# Patient Record
Sex: Female | Born: 1990 | Race: Black or African American | Hispanic: No | Marital: Single | Smoking: Never smoker
Health system: Southern US, Community
[De-identification: ages and names within clinical notes are randomized; demographics above are authoritative.]

## PROBLEM LIST (undated history)

## (undated) HISTORY — PX: KNEE ARTHROSCOPY: SHX127

---
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
## 2008-08-03 ENCOUNTER — Emergency Department (HOSPITAL_COMMUNITY): Admission: EM | Admit: 2008-08-03 | Discharge: 2008-08-03 | Payer: Self-pay | Admitting: Emergency Medicine

## 2011-04-26 NOTE — Discharge Summary (Signed)
Whitney Thompson, GEISLER                            ACCOUNT NO.:  [id number]   MEDICAL RECORD NO.:  [id number]                   PATIENT TYPE:  INP   LOCATION:  6148                                 FACILITY:  [facility]   PHYSICIAN:  Pediatrics Resident                 DATE OF BIRTH:  1991/09/20   DATE OF ADMISSION:  02/22/2004  DATE OF DISCHARGE:  02/25/2004                                 DISCHARGE SUMMARY   [name] was admitted on 02/23/2004 with 4 days of fever and a stabbing  headache and 1 day of a stiff neck.  She had both photo and phonophobia and  mild tachycardia on admission.  She was afebrile at admission and had no  papilledema.  She had no neurological abnormalities noted on admission.  She  had 6.9 white blood cells, 72% PMNs.  A lumbar puncture was done and her CSF  was colorless and clear.  Tube 1 had 230 RBCs, 286 WBCs, and 100%  lymphocytes.  Tube 4 had 84 RBCs, 81 WBCs, and 100% lymphocytes.  The CSF  showed 91 mg/dL of total protein and 51 mg/dL of glucose.  The Gram stain  showed no organisms and mostly mononuclear WBCs.  The CSF culture was  negative for growth at 2 days.  Prior to the LP, a CT of the head was done  which was normal and showed no intracranial masses.  In the emergency room,  the patient was started on ceftriaxone 2 g q.24h. and morphine for pain.  Of  note, the patient had a prior history of hives with codeine but had no  adverse reaction to morphine.  Due to the patient's history of exercise-  induced asthma she was given an albuterol MDI q.6h. p.r.n. which was not  needed during the hospitalization.  On day 2 of hospitalization, her  morphine was discontinued secondary to concern of allergy in the past and  she was put on Demerol 50 mg.  At the time of discharge, this was changed to  Naprosyn 500 mg p.o. x 7 days 2 times a day then every 12 hours if she is  still in pain.  Because of the CSF results listed above that are consistent  with an aseptic  meningitis and the negative CSF cultures x 48 hours we felt  assured that [facility] had aseptic meningitis and she was discharged on  02/25/2004.  At this time, she was tolerating p.o. fluids well and the  ceftriaxone was discontinued secondary to the negative cultures x 48 hours.  The patient's condition was improved at the time of discharge.  She was  instructed to see her PCP (Dr. Nash Dimmer at [facility]) at 10:30 a.m. on 02/27/2004.  She was not sent home with any diet  restrictions or activity restrictions and was informed that she could return  to school and her normal activities as her  energy and pain level tolerated.   ADDENDUM:  The enterovirus PCR test which was sent per the rest of Dr.  Nash Dimmer on the morning after admission came back with negative PCR results.  This does not change our treatment regimen.                                                Pediatrics Resident    PR/MEDQ  D:  02/28/2004  T:  03/01/2004  Job:  [id number]

## 2012-05-08 ENCOUNTER — Emergency Department (HOSPITAL_COMMUNITY): Payer: Self-pay

## 2012-05-08 ENCOUNTER — Emergency Department (HOSPITAL_COMMUNITY)
Admission: EM | Admit: 2012-05-08 | Discharge: 2012-05-08 | Disposition: A | Discharge status: Home / Self Care | Payer: Self-pay | Attending: Emergency Medicine | Admitting: Emergency Medicine

## 2012-05-08 ENCOUNTER — Encounter (HOSPITAL_COMMUNITY): Payer: Self-pay | Admitting: *Deleted

## 2012-05-08 DIAGNOSIS — M25562 Pain in left knee: Secondary | ICD-10-CM

## 2012-05-08 DIAGNOSIS — M25569 Pain in unspecified knee: Secondary | ICD-10-CM | POA: Insufficient documentation

## 2012-05-08 MED ORDER — CYCLOBENZAPRINE HCL 10 MG PO TABS: 10.0000 mg | ORAL_TABLET | Freq: Two times a day (BID) | ORAL | Status: AC | PRN

## 2012-05-08 MED ORDER — OXYCODONE-ACETAMINOPHEN 5-325 MG PO TABS
1.0000 | ORAL_TABLET | Freq: Once | ORAL | Status: AC
  Administered 2012-05-08: 1 via ORAL
  Filled 2012-05-08: qty 1

## 2012-05-08 NOTE — Discharge Instructions (Signed)
Ms Grobe the x-ray of no acute findings. Followup with your trainer and the ortho referral.  Continue the mobic and we will add muscle relexor but do not drive with this medication.  Return to ER for severe pain or other concerns.

## 2012-05-08 NOTE — ED Provider Notes (Signed)
Medical screening examination/treatment/procedure(s) were performed by non-physician practitioner and as supervising physician I was immediately available for consultation/collaboration.    Nelia Shi, MD 05/08/12 [phone]

## 2012-05-08 NOTE — ED Provider Notes (Signed)
History     CSN: [id number]  Arrival date & time 05/08/12  7829   First MD Initiated Contact with Patient 05/08/12 [phone]      Chief Complaint  Patient presents with  . Knee Pain    (Consider location/radiation/quality/duration/timing/severity/associated sxs/prior treatment) Patient is a 21 y.o. female presenting with knee pain. The history is provided by the patient. No language interpreter was used.  Knee Pain This is a chronic problem. The current episode started more than 1 year ago. The problem occurs daily. The problem has been gradually worsening. Associated symptoms include myalgias. Pertinent negatives include no joint swelling or numbness. The symptoms are aggravated by nothing. She has tried nothing for the symptoms.   21 year old female coming in with left chronic knee pain. 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 The pain is chronic and it has been going on for 4 years to her L knee.   Patient is able to bear weight. No patellar tenderness or fibular head tenderness.   She had the x-ray of her left knee that was unremarkable  4 years ago. States that she was playing basketball 2 weeks ago and got kneed above the knee and just wants to be checked out and get something for pain. No bruising or deformity to the L quadricep muscle.  Good cms.  History reviewed. No pertinent past medical history.  History reviewed. No pertinent past surgical history.  No family history on file.  History  Substance Use Topics  . Smoking status: Never Smoker   . Smokeless tobacco: Not on file  . Alcohol Use: No    OB History    Grav Para Term Preterm Abortions TAB SAB Ect Mult Living                  Review of Systems  Constitutional: Negative.   HENT: Negative.   Eyes: Negative.   Respiratory: Negative.   Cardiovascular: Negative.   Gastrointestinal: Negative.   Musculoskeletal: Positive for myalgias.  Negative for joint swelling.       L Knee pain/ L quad pain  Neurological: Negative.  Negative for numbness.  Psychiatric/Behavioral: Negative.   All other systems reviewed and are negative.    Allergies  Codeine  Home Medications  No current outpatient prescriptions on file.  BP 122/84  Pulse 70  Temp(Src) 97.5 F (36.4 C) (Oral)  Resp 20  SpO2 99%  LMP 04/27/2012  Physical Exam  Nursing note and vitals reviewed. Constitutional: She is oriented to person, place, and time. She appears well-developed and well-nourished.  HENT:  Head: Normocephalic and atraumatic.  Eyes: Conjunctivae and EOM are normal. Pupils are equal, round, and reactive to light.  Neck: Normal range of motion. Neck supple.  Cardiovascular: Normal rate, regular rhythm, normal heart sounds and intact distal pulses.  Exam reveals no gallop and no friction rub.   No murmur heard. Pulmonary/Chest: Effort normal and breath sounds normal.  Abdominal: Soft. Bowel sounds are normal.  Musculoskeletal: Normal range of motion. She exhibits no edema and no tenderness.       Pain with palpatation to the L lateral knee.  No pain with external or internal rotation of the foot.   Neurological: She is alert and oriented to person, place, and time. She has normal reflexes.  Skin: Skin is warm and dry.  Psychiatric: She has a normal mood and affect.    ED Course  Procedures (  including critical care time)  Labs Reviewed - No data to display No results found.   No diagnosis found.    MDM   L knee pain x 4 years and L quad pain x 2-3 weeks.  Film of knee negative for acute findings.  rx for Flexoril.  Continue mobic.  Follow up with ortho next week.  No crutches or immobilizer required.         Remi Haggard, NP 05/08/12 1904

## 2012-05-08 NOTE — ED Notes (Signed)
Patient transported to X-ray 

## 2012-05-08 NOTE — ED Notes (Signed)
Pt reports injured left knee x 2-3 weeks ago while playing basketball. Pt reports "kneed" in left quadricept. Hx of similar pain x a couple of years. No obvious deformity.

## 2013-05-12 ENCOUNTER — Emergency Department (HOSPITAL_COMMUNITY)
Admission: EM | Admit: 2013-05-12 | Discharge: 2013-05-12 | Disposition: A | Discharge status: Home / Self Care | Payer: Self-pay | Attending: Emergency Medicine | Admitting: Emergency Medicine

## 2013-05-12 ENCOUNTER — Encounter (HOSPITAL_COMMUNITY): Payer: Self-pay | Admitting: Emergency Medicine

## 2013-05-12 ENCOUNTER — Emergency Department (HOSPITAL_COMMUNITY): Payer: Self-pay

## 2013-05-12 DIAGNOSIS — Z791 Long term (current) use of non-steroidal anti-inflammatories (NSAID): Secondary | ICD-10-CM | POA: Insufficient documentation

## 2013-05-12 DIAGNOSIS — Y929 Unspecified place or not applicable: Secondary | ICD-10-CM | POA: Insufficient documentation

## 2013-05-12 DIAGNOSIS — M542 Cervicalgia: Secondary | ICD-10-CM | POA: Insufficient documentation

## 2013-05-12 DIAGNOSIS — M25511 Pain in right shoulder: Secondary | ICD-10-CM

## 2013-05-12 DIAGNOSIS — X58XXXA Exposure to other specified factors, initial encounter: Secondary | ICD-10-CM | POA: Insufficient documentation

## 2013-05-12 DIAGNOSIS — Y9367 Activity, basketball: Secondary | ICD-10-CM | POA: Insufficient documentation

## 2013-05-12 DIAGNOSIS — IMO0001 Reserved for inherently not codable concepts without codable children: Secondary | ICD-10-CM | POA: Insufficient documentation

## 2013-05-12 DIAGNOSIS — M259 Joint disorder, unspecified: Secondary | ICD-10-CM | POA: Insufficient documentation

## 2013-05-12 MED ORDER — NAPROXEN 500 MG PO TABS: 500.0000 mg | ORAL_TABLET | Freq: Two times a day (BID) | ORAL | Status: DC

## 2013-05-12 NOTE — ED Notes (Signed)
Pt states she is having pain in her right shoulder pain for about the past month  Pt states she had no definite injury   Pt states she is very athletic and notices the pain mostly when she shoots a basketball  Pt states now the pain is going down to her elbow

## 2013-05-12 NOTE — ED Notes (Signed)
Patient returned from X-ray 

## 2013-05-12 NOTE — ED Provider Notes (Signed)
History     CSN: [id number]  Arrival date & time 05/12/13  2046   First MD Initiated Contact with Patient 05/12/13 2109      Chief Complaint  Patient presents with  . Shoulder Pain    (Consider location/radiation/quality/duration/timing/severity/associated sxs/prior treatment) Patient is a 22 y.o. female presenting with shoulder pain. The history is provided by the patient.  Shoulder Pain This is a new problem. Pertinent negatives include no chest pain, no abdominal pain and no shortness of breath.   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 Patient having recurrent pain at the a.c. joint area with shooting radiates down to the elbow. No numbness in the fingers. The pain can sometimes be acute very much of a shooting pain in the arm feels numb for a brief period of time but stopped persistent. Pain at worst can be a 10 out of 10. No new injury. Pain radiates somewhat towards the neck and down to the elbow. Mostly aggravated by the shooting mechanism.  History reviewed. No pertinent past medical history.  History reviewed. No pertinent past surgical history.  Family History  Problem Relation Age of Onset  . Kidney disease Other   . Hypertension Other     History  Substance Use Topics  . Smoking status: Never Smoker   . Smokeless tobacco: Not on file  . Alcohol Use: No    OB History   Grav Para Term Preterm Abortions TAB SAB Ect Mult Living                  Review of Systems  Constitutional: Negative for fever.  HENT: Positive for neck pain. Negative for neck stiffness.   Eyes: Negative for visual disturbance.  Respiratory: Negative for shortness of breath.   Cardiovascular: Negative for chest pain.  Gastrointestinal: Negative for abdominal pain.  Genitourinary: Negative for dysuria.  Musculoskeletal: Negative for back pain and joint swelling.  Skin: Negative for rash.  Neurological: Negative for weakness and  numbness.  Psychiatric/Behavioral: Negative for confusion.    Allergies  Codeine  Home Medications   Current Outpatient Rx  Name  Route  Sig  Dispense  Refill  . Multiple Vitamin (MULITIVITAMIN WITH MINERALS) TABS   Oral   Take 1 tablet by mouth daily.         . naproxen sodium (ANAPROX) 220 MG tablet   Oral   Take 440 mg by mouth 2 (two) times daily with a meal.         . naproxen (NAPROSYN) 500 MG tablet   Oral   Take 1 tablet (500 mg total) by mouth 2 (two) times daily.   14 tablet   0     BP 114/69  Pulse 78  Temp(Src) 98.5 F (36.9 C) (Oral)  Resp 16  Ht 6\' 1"  (1.854 m)  Wt 150 lb (68.04 kg)  BMI 19.79 kg/m2  SpO2 100%  LMP 05/11/2013  Physical Exam  Nursing note and vitals reviewed. Constitutional: She is oriented to person, place, and time. She appears well-developed and well-nourished. No distress.  HENT:  Head: Normocephalic and atraumatic.  Eyes: Conjunctivae are normal. Pupils are equal, round, and reactive to light.  Neck: Normal range of motion. Neck supple. No JVD present. No tracheal deviation present.  Cardiovascular: Normal rate, regular rhythm, normal heart sounds and intact distal pulses.   No murmur heard. Pulmonary/Chest: Effort normal and breath sounds normal.  Abdominal: Soft. Bowel sounds are normal.  Musculoskeletal: Normal range of motion. She exhibits tenderness. She exhibits no edema.  Mild tenderness to palpation of the right shoulder at the a.c. joint. Good range of motion good range of motion of arm above head so not likely rotator cuff. No sensory deficits radial pulses 2+. Patient's right hand dominant.  Lymphadenopathy:    She has no cervical adenopathy.  Neurological: She is alert and oriented to person, place, and time. No cranial nerve deficit. She exhibits normal muscle tone. Coordination normal.  Skin: Skin is warm. No rash noted.    ED Course  Procedures (including critical care time)  Labs Reviewed - No data to  display Dg Shoulder Right  05/12/2013   *RADIOLOGY REPORT*  Clinical Data: Right shoulder injury, with pain.  RIGHT SHOULDER - 2+ VIEW  Comparison: None.  Findings: There is no evidence of fracture or dislocation.  The right humeral head is seated within the glenoid fossa.  The acromioclavicular joint is unremarkable in appearance.  No significant soft tissue abnormalities are seen.  The visualized portions of the right lung are clear.  IMPRESSION: No evidence of fracture or dislocation.   Original Report Authenticated By: Tonia Ghent, M.D.     1. [facility] joint pain, right       MDM  Symptoms based on location are suggestive of an acromioclavicular joint pain problem. Does not sound like rotator cuff. Could be the brachial plexus entrapment thoracic outlet problem. Patient neurovascularly intact we'll send to orthopedics for further evaluation rest the arm with a sling in the meantime can take anti-inflammatories. X-rays of the arm are negative.         Shelda Jakes, MD 05/12/13 2218

## 2014-04-19 ENCOUNTER — Emergency Department (HOSPITAL_COMMUNITY)
Admission: EM | Admit: 2014-04-19 | Discharge: 2014-04-19 | Disposition: A | Discharge status: Home / Self Care | Payer: Medicaid Other | Attending: Emergency Medicine | Admitting: Emergency Medicine

## 2014-04-19 ENCOUNTER — Encounter (HOSPITAL_COMMUNITY): Payer: Self-pay | Admitting: Emergency Medicine

## 2014-04-19 DIAGNOSIS — S8392XA Sprain of unspecified site of left knee, initial encounter: Secondary | ICD-10-CM

## 2014-04-19 DIAGNOSIS — IMO0002 Reserved for concepts with insufficient information to code with codable children: Secondary | ICD-10-CM | POA: Insufficient documentation

## 2014-04-19 DIAGNOSIS — Y939 Activity, unspecified: Secondary | ICD-10-CM | POA: Insufficient documentation

## 2014-04-19 DIAGNOSIS — X58XXXA Exposure to other specified factors, initial encounter: Secondary | ICD-10-CM | POA: Insufficient documentation

## 2014-04-19 DIAGNOSIS — Y929 Unspecified place or not applicable: Secondary | ICD-10-CM | POA: Insufficient documentation

## 2014-04-19 DIAGNOSIS — Z791 Long term (current) use of non-steroidal anti-inflammatories (NSAID): Secondary | ICD-10-CM | POA: Insufficient documentation

## 2014-04-19 MED ORDER — HYDROCODONE-ACETAMINOPHEN 5-325 MG PO TABS: 1.0000 | ORAL_TABLET | ORAL | Status: DC | PRN

## 2014-04-19 NOTE — ED Notes (Signed)
Pt reports L knee pain, denies injury at this time.  No swelling noted at this time.

## 2014-04-19 NOTE — ED Provider Notes (Signed)
CSN: [id number]     Arrival date & time 04/19/14  1858 History   This chart was scribed for Ruby Colaatherine Nyjah Denio, PA working with Toy BakerAnthony T Allen, MD, by Bronson CurbJacqueline Melvin, ED Scribe. This patient was seen in room WTR8/WTR8 and the patient's care was started at 9:43 PM.    Chief Complaint  Patient presents with  . Knee Pain    The history is provided by the patient. No language interpreter was used.    HPI Comments: Whitney Thompson is a 23 y.o. female who presents to the Emergency Department complaining of gradually worsening, constant left knee pain that began 1 month ago. Patient describes the pain as shooting, and states it feels like her "knee giving out". Patient plays basketball, but denies injury while playing. She states the pain is worse when weight bearing and ambulating. Patient takes Aleve and ibuprofen daily, as well as takes ice baths routinely with no relief. She denies any other injuries. Patient has no history of significant health medical conditions.   History reviewed. No pertinent past medical history. History reviewed. No pertinent past surgical history. Family History  Problem Relation Age of Onset  . Kidney disease Other   . Hypertension Other    History  Substance Use Topics  . Smoking status: Never Smoker   . Smokeless tobacco: Not on file  . Alcohol Use: No   OB History   Grav Para Term Preterm Abortions TAB SAB Ect Mult Living                 Review of Systems  Musculoskeletal: Negative for joint swelling.       Left knee pain.  All other systems reviewed and are negative.     Allergies  Codeine  Home Medications   Prior to Admission medications   Medication Sig Start Date End Date Taking? Authorizing Provider  Multiple Vitamin (MULITIVITAMIN WITH MINERALS) TABS Take 1 tablet by mouth daily.   Yes Historical Provider, MD  naproxen sodium (ANAPROX) 220 MG tablet Take 440 mg by mouth 2 (two) times daily with a meal.   Yes Historical Provider,  MD   Triage Vitals: BP 114/63  Pulse 81  Temp(Src) 98 F (36.7 C) (Oral)  Resp 16  SpO2 100%  Physical Exam  Nursing note and vitals reviewed. Constitutional: She is oriented to person, place, and time. She appears well-developed and well-nourished. No distress.  HENT:  Head: Normocephalic and atraumatic.  Eyes:  Normal appearance  Neck: Normal range of motion.  Pulmonary/Chest: Effort normal.  Musculoskeletal: Normal range of motion.  L knee w/out deformity, erythema, ecchymosis, edema.  Tenderness at lateral aspect of knee as well as distal IT band.  Pain w/ valgus and especially varus pressure.  2+ DP pulse and distal sensation intact.    Neurological: She is alert and oriented to person, place, and time.  Psychiatric: She has a normal mood and affect. Her behavior is normal.    ED Course  Procedures (including critical care time)  DIAGNOSTIC STUDIES: Oxygen Saturation is 100% on room air, normal by my interpretation.    COORDINATION OF CARE: At 2154 Discussed treatment plan with patient which includes Norco/Vicodin. Patient agrees.   Labs Review Labs Reviewed - No data to display  Imaging Review No results found.   EKG Interpretation None      MDM   Final diagnoses:  Sprain of left knee    Healthy 22yo F presents w/ non-traumatic, lateral L knee pain x 4  weeks.  Pt plays basketball.  Has been taking and NSAID and resting and icing w/out relief.  No signs of septic arthritis.  Suspect IT band syndrome or LCL injury.  Pt declined crutches and knee sleeve.  Prescribed short course of vicodin and referred to ortho for further evaluation.    I personally performed the services described in this documentation, which was scribed in my presence. The recorded information has been reviewed and is accurate.     Otilio Miuatherine E Deyonte Cadden, PA-C 04/20/14 [phone]

## 2014-04-19 NOTE — Discharge Instructions (Signed)
Take vicodin as prescribed for severe pain.   Do not drive within four hours of taking this medication (may cause drowsiness or confusion).  Take up to 800mg  of ibuprofen three times a day for the next 3-4 days (take with food).  Ice 3 times a day for 15-20 minutes.  Elevate when possible to decrease swelling and pain.  Activity as tolerated.  Follow up with the orthopedic doctor you have been referred to. You may return to the ER if your pain worsens or you have any other concerns.

## 2014-04-19 NOTE — ED Notes (Signed)
Bed: WTR8 Expected date:  Expected time:  Means of arrival:  Comments: Pt waiting for case management

## 2014-04-20 ENCOUNTER — Emergency Department (HOSPITAL_COMMUNITY)
Admission: EM | Admit: 2014-04-20 | Discharge: 2014-04-20 | Disposition: A | Discharge status: Home / Self Care | Payer: Medicaid Other | Attending: Emergency Medicine | Admitting: Emergency Medicine

## 2014-04-20 ENCOUNTER — Encounter (HOSPITAL_COMMUNITY): Payer: Self-pay | Admitting: Emergency Medicine

## 2014-04-20 DIAGNOSIS — Z791 Long term (current) use of non-steroidal anti-inflammatories (NSAID): Secondary | ICD-10-CM | POA: Insufficient documentation

## 2014-04-20 DIAGNOSIS — M25562 Pain in left knee: Secondary | ICD-10-CM

## 2014-04-20 DIAGNOSIS — G8911 Acute pain due to trauma: Secondary | ICD-10-CM | POA: Insufficient documentation

## 2014-04-20 DIAGNOSIS — M25569 Pain in unspecified knee: Secondary | ICD-10-CM | POA: Insufficient documentation

## 2014-04-20 MED ORDER — TRAMADOL HCL 50 MG PO TABS: 50.0000 mg | ORAL_TABLET | Freq: Four times a day (QID) | ORAL | Status: DC | PRN

## 2014-04-20 NOTE — ED Notes (Signed)
Pt presents to department for evaluation of L knee pain. States she injured L knee previously, now states she was playing basketball this week and felt sharp pain to knee. 5/10 pain upon arrival, becomes worse with movement and weight bearing.

## 2014-04-20 NOTE — ED Provider Notes (Signed)
Medical screening examination/treatment/procedure(s) were performed by non-physician practitioner and as supervising physician I was immediately available for consultation/collaboration.   Suhan Paci T Saylah Ketner, MD 04/20/14 2324 

## 2014-04-20 NOTE — Discharge Instructions (Signed)
Take Tramadol as needed for pain. Refer to attached documents for more information. Follow up with the recommended orthopedic doctor.

## 2014-04-20 NOTE — ED Provider Notes (Signed)
CSN: [id number]     Arrival date & time 04/20/14  1250 History  This chart was scribed for non-physician practitioner working with Rolland PorterMark James, MD, by Jarvis Morganaylor Ferguson, ED Scribe. This patient was seen in room TR10C/TR10C and the patient's care was started at 3:15 PM.    Chief Complaint  Patient presents with  . Knee Pain      Patient is a 23 y.o. female presenting with knee pain. The history is provided by the patient. No language interpreter was used.  Knee Pain Location:  Knee Time since incident:  2 days Injury: yes   Mechanism of injury comment:  Playing basketball Knee location:  L knee Pain details:    Quality:  Sharp (pinching)   Severity:  Mild   Progression:  Unchanged Chronicity:  New Dislocation: no   Worsened by:  Activity Ineffective treatments:  Elevation, ice and NSAIDs  HPI Comments: Whitney DellenJasmine L Thompson is a 23 y.o. female who presents to the Emergency Department complaining of mild, sharp, "pinching" left knee pain for 2 days. Patient states that she injured the knee on Monday (04/18/14) while playing basketball. She states that she has had this left knee pain intermittently for 4 weeks but that she believes she re-injured it on Monday. [facility] for the same symptoms and was evaluated and prescribed Vicodin. Patient states that she did not get the prescription filled. Patient states she took ibuprofen, Aleve, ice compress and elevation with no relief. Patient states that the pain is exacerbated by ambulation. Patient states that she is allergic to Codeine.   History reviewed. No pertinent past medical history. History reviewed. No pertinent past surgical history. Family History  Problem Relation Age of Onset  . Kidney disease Other   . Hypertension Other    History  Substance Use Topics  . Smoking status: Never Smoker   . Smokeless tobacco: Not on file  . Alcohol Use: No   OB History   Grav Para Term Preterm Abortions TAB SAB Ect  Mult Living                 Review of Systems  Musculoskeletal: Positive for arthralgias (left knee pain).  All other systems reviewed and are negative.     Allergies  Codeine  Home Medications   Prior to Admission medications   Medication Sig Start Date End Date Taking? Authorizing Provider  Multiple Vitamin (MULITIVITAMIN WITH MINERALS) TABS Take 1 tablet by mouth daily.   Yes Historical Provider, MD  naproxen sodium (ANAPROX) 220 MG tablet Take 440 mg by mouth 2 (two) times daily with a meal.   Yes Historical Provider, MD  HYDROcodone-acetaminophen (NORCO/VICODIN) 5-325 MG per tablet Take 1 tablet by mouth every 4 (four) hours as needed for moderate pain. 04/19/14   Arie Sabinaatherine E Schinlever, PA-C   Triage Vitals: BP 115/67  Pulse 70  Temp(Src) 98.1 F (36.7 C) (Oral)  Resp 18  SpO2 100%  Physical Exam  Nursing note and vitals reviewed. Constitutional: She is oriented to person, place, and time. She appears well-developed and well-nourished. No distress.  HENT:  Head: Normocephalic and atraumatic.  Right Ear: External ear normal.  Left Ear: External ear normal.  Nose: Nose normal.  Mouth/Throat: Oropharynx is clear and moist.  Eyes: Conjunctivae are normal.  Neck: Normal range of motion. Neck supple.  Cardiovascular: Normal rate.   Pulmonary/Chest: Effort normal.  Abdominal: Soft.  Musculoskeletal: Normal range of motion.  Left lateral knee tenderness  to palpation. No deformity. No swelling.  Neurological: She is alert and oriented to person, place, and time.  Skin: Skin is warm and dry. She is not diaphoretic.  Psychiatric: She has a normal mood and affect.    ED Course  Procedures (including critical care time)  DIAGNOSTIC STUDIES: Oxygen Saturation is 100% on RA, normal by my interpretation.    COORDINATION OF CARE: 3:20 PM- Will order and discharge patient with Ultram and advised patient to f/u with an orthopedic specialist. Pt advised of plan for treatment  and pt agrees.   Labs Review Labs Reviewed - No data to display  Imaging Review No results found.   EKG Interpretation None      MDM   Final diagnoses:  Left knee pain    Patient's xray from yesterday is unremarkable. No new injury. Patient did not fill her Rx vicodin from yesterday. Patient advised to rest, ice, and elevate. Patient was given an Orthopedic referral yesterday and was advised to make an appointment.   I personally performed the services described in this documentation, which was scribed in my presence. The recorded information has been reviewed and is accurate.     Emilia BeckKaitlyn Tiarra Anastacio, [place] JerseyPA-C 04/21/14 [phone]

## 2014-04-26 NOTE — ED Provider Notes (Signed)
Medical screening examination/treatment/procedure(s) were performed by non-physician practitioner and as supervising physician I was immediately available for consultation/collaboration.   EKG Interpretation None        Rolland PorterMark Melodee Lupe, MD 04/26/14 [phone]

## 2017-11-27 ENCOUNTER — Emergency Department (HOSPITAL_COMMUNITY)
Admission: EM | Admit: 2017-11-27 | Discharge: 2017-11-27 | Disposition: A | Discharge status: Home / Self Care | Payer: Self-pay | Attending: Emergency Medicine | Admitting: Emergency Medicine

## 2017-11-27 ENCOUNTER — Encounter (HOSPITAL_COMMUNITY): Payer: Self-pay | Admitting: Emergency Medicine

## 2017-11-27 ENCOUNTER — Other Ambulatory Visit: Payer: Self-pay

## 2017-11-27 DIAGNOSIS — R630 Anorexia: Secondary | ICD-10-CM | POA: Insufficient documentation

## 2017-11-27 DIAGNOSIS — R109 Unspecified abdominal pain: Secondary | ICD-10-CM

## 2017-11-27 DIAGNOSIS — R112 Nausea with vomiting, unspecified: Secondary | ICD-10-CM | POA: Insufficient documentation

## 2017-11-27 DIAGNOSIS — R1110 Vomiting, unspecified: Secondary | ICD-10-CM

## 2017-11-27 DIAGNOSIS — R51 Headache: Secondary | ICD-10-CM | POA: Insufficient documentation

## 2017-11-27 DIAGNOSIS — R1084 Generalized abdominal pain: Secondary | ICD-10-CM | POA: Insufficient documentation

## 2017-11-27 DIAGNOSIS — R197 Diarrhea, unspecified: Secondary | ICD-10-CM | POA: Insufficient documentation

## 2017-11-27 LAB — URINALYSIS, ROUTINE W REFLEX MICROSCOPIC
Bilirubin Urine: NEGATIVE
Glucose, UA: NEGATIVE mg/dL
Hgb urine dipstick: NEGATIVE
Ketones, ur: NEGATIVE mg/dL
Leukocytes, UA: NEGATIVE
Nitrite: NEGATIVE
Protein, ur: NEGATIVE mg/dL
Specific Gravity, Urine: 1.025 (ref 1.005–1.030)
pH: 5 (ref 5.0–8.0)

## 2017-11-27 LAB — I-STAT BETA HCG BLOOD, ED (MC, WL, AP ONLY)

## 2017-11-27 LAB — COMPREHENSIVE METABOLIC PANEL
ALBUMIN: 4.2 g/dL (ref 3.5–5.0)
ALT: 26 U/L (ref 14–54)
ANION GAP: 6 (ref 5–15)
AST: 29 U/L (ref 15–41)
Alkaline Phosphatase: 66 U/L (ref 38–126)
BUN: 20 mg/dL (ref 6–20)
CALCIUM: 9.5 mg/dL (ref 8.9–10.3)
CHLORIDE: 106 mmol/L (ref 101–111)
CO2: 26 mmol/L (ref 22–32)
Creatinine, Ser: 1.15 mg/dL — ABNORMAL HIGH (ref 0.44–1.00)
GFR calc non Af Amer: >60 mL/min (ref >60)
Glucose, Bld: 112 mg/dL — ABNORMAL HIGH (ref 65–99)
POTASSIUM: 3.5 mmol/L (ref 3.5–5.1)
SODIUM: 138 mmol/L (ref 135–145)
Total Bilirubin: 0.8 mg/dL (ref 0.3–1.2)
Total Protein: 7.4 g/dL (ref 6.5–8.1)

## 2017-11-27 LAB — CBC
HEMATOCRIT: 43.6 % (ref 36.0–46.0)
HEMOGLOBIN: 15.1 g/dL — AB (ref 12.0–15.0)
MCH: 31.5 pg (ref 26.0–34.0)
MCHC: 34.6 g/dL (ref 30.0–36.0)
MCV: 90.8 fL (ref 78.0–100.0)
Platelets: 274 10*3/uL (ref 150–400)
RBC: 4.8 MIL/uL (ref 3.87–5.11)
RDW: 13.6 % (ref 11.5–15.5)
WBC: 7.9 10*3/uL (ref 4.0–10.5)

## 2017-11-27 LAB — LIPASE, BLOOD: LIPASE: 40 U/L (ref 11–51)

## 2017-11-27 MED ORDER — ONDANSETRON HCL 4 MG/2ML IJ SOLN
4.0000 mg | Freq: Once | INTRAMUSCULAR | Status: AC
  Administered 2017-11-27: 4 mg via INTRAVENOUS
  Filled 2017-11-27: qty 2

## 2017-11-27 MED ORDER — SODIUM CHLORIDE 0.9 % IV BOLUS (SEPSIS)
1000.0000 mL | Freq: Once | INTRAVENOUS | Status: AC
  Administered 2017-11-27: 1000 mL via INTRAVENOUS

## 2017-11-27 MED ORDER — KETOROLAC TROMETHAMINE 15 MG/ML IJ SOLN
15.0000 mg | Freq: Once | INTRAMUSCULAR | Status: AC
  Administered 2017-11-27: 15 mg via INTRAVENOUS
  Filled 2017-11-27: qty 1

## 2017-11-27 MED ORDER — METOCLOPRAMIDE HCL 5 MG/ML IJ SOLN
10.0000 mg | Freq: Once | INTRAMUSCULAR | Status: AC
  Administered 2017-11-27: 10 mg via INTRAVENOUS
  Filled 2017-11-27: qty 2

## 2017-11-27 MED ORDER — ONDANSETRON 4 MG PO TBDP
4.0000 mg | ORAL_TABLET | Freq: Once | ORAL | Status: AC
  Administered 2017-11-27: 4 mg via ORAL
  Filled 2017-11-27: qty 1

## 2017-11-27 MED ORDER — ACETAMINOPHEN 325 MG PO TABS
650.0000 mg | ORAL_TABLET | Freq: Once | ORAL | Status: AC
  Administered 2017-11-27: 650 mg via ORAL
  Filled 2017-11-27: qty 2

## 2017-11-27 MED ORDER — PROMETHAZINE HCL 25 MG PO TABS: 25.0000 mg | ORAL_TABLET | Freq: Four times a day (QID) | ORAL | 0 refills | Status: AC | PRN

## 2017-11-27 NOTE — ED Triage Notes (Signed)
Pt c/o 10/10 abd pain with nausea and diarrhea since 4 am today, no fever or chills.

## 2017-11-27 NOTE — ED Provider Notes (Addendum)
[facility] Provider Note   CSN: [id number] Arrival date & time: 11/27/17  [id number]     History   Chief Complaint Chief Complaint  Patient presents with  . Abdominal Pain    HPI Whitney Thompson is a 26 y.o. female.  The history is provided by the patient.  Abdominal Pain   This is a new problem. The current episode started 3 to 5 hours ago. The problem occurs hourly. The problem has not changed since onset.The pain is associated with an unknown factor. The pain is located in the generalized abdominal region. The quality of the pain is cramping and colicky. The pain is at a severity of 5/10. The pain is moderate. Associated symptoms include anorexia, diarrhea, flatus, nausea, vomiting and headaches. Pertinent negatives include fever, constipation, dysuria and frequency. Nothing aggravates the symptoms. Nothing relieves the symptoms. Past medical history comments: no hx.    History reviewed. No pertinent past medical history.  There are no active problems to display for this patient.   History reviewed. No pertinent surgical history.  OB History    No data available       Home Medications    Prior to Admission medications   Medication Sig Start Date End Date Taking? Authorizing Provider  HYDROcodone-acetaminophen (NORCO/VICODIN) 5-325 MG per tablet Take 1 tablet by mouth every 4 (four) hours as needed for moderate pain. 04/19/14   Schinlever, Santina Evansatherine, PA-C  Multiple Vitamin (MULITIVITAMIN WITH MINERALS) TABS Take 1 tablet by mouth daily.    [provider]  naproxen sodium (ANAPROX) 220 MG tablet Take 440 mg by mouth 2 (two) times daily with a meal.    [provider]  traMADol (ULTRAM) 50 MG tablet Take 1 tablet (50 mg total) by mouth every 6 (six) hours as needed. 04/20/14   Emilia BeckSzekalski, Kaitlyn, PA-C    Family History Family History  Problem Relation Age of Onset  . Kidney disease Other   . Hypertension Other      Social History Social History   Tobacco Use  . Smoking status: Never Smoker  . Smokeless tobacco: Never Used  Substance Use Topics  . Alcohol use: No  . Drug use: No     Allergies   Codeine   Review of Systems Review of Systems  Constitutional: Negative for fever.  Gastrointestinal: Positive for abdominal pain, anorexia, diarrhea, flatus, nausea and vomiting. Negative for constipation.  Genitourinary: Negative for dysuria and frequency.  Neurological: Positive for headaches.  All other systems reviewed and are negative.    Physical Exam Updated Vital Signs BP 121/70 (BP Location: Right Arm)   Pulse 96   Temp 98.6 F (37 C) (Oral)   Resp 20   Ht 6' (1.829 m)   Wt 68 kg (150 lb)   LMP 11/23/2017   SpO2 100%   BMI 20.34 kg/m   Physical Exam  Constitutional: She is oriented to person, place, and time. She appears well-developed and well-nourished. No distress.  HENT:  Head: Normocephalic and atraumatic.  Mouth/Throat: Oropharynx is clear and moist. Mucous membranes are dry.  Eyes: Conjunctivae and EOM are normal. Pupils are equal, round, and reactive to light.  Neck: Normal range of motion. Neck supple.  Cardiovascular: Normal rate, regular rhythm and intact distal pulses.  No murmur heard. Pulmonary/Chest: Effort normal and breath sounds normal. No respiratory distress. She has no wheezes. She has no rales.  Abdominal: Soft. Bowel sounds are normal. She exhibits no distension. There is generalized  tenderness. There is no rebound and no guarding.  Musculoskeletal: Normal range of motion. She exhibits no edema or tenderness.  Neurological: She is alert and oriented to person, place, and time.  Skin: Skin is warm and dry. No rash noted. No erythema.  Psychiatric: She has a normal mood and affect. Her behavior is normal.  Nursing note and vitals reviewed.    ED Treatments / Results  Labs (all labs ordered are listed, but only abnormal results are  displayed) Labs Reviewed  COMPREHENSIVE METABOLIC PANEL - Abnormal; Notable for the following components:      Result Value   Glucose, Bld 112 (*)    Creatinine, Ser 1.15 (*)    All other components within normal limits  CBC - Abnormal; Notable for the following components:   Hemoglobin 15.1 (*)    All other components within normal limits  URINALYSIS, ROUTINE W REFLEX MICROSCOPIC - Abnormal; Notable for the following components:   APPearance HAZY (*)    All other components within normal limits  LIPASE, BLOOD  I-STAT BETA HCG BLOOD, ED ([facility], [facility], [facility] ONLY)    EKG  EKG Interpretation None       Radiology No results found.  Procedures Procedures (including critical care time)  Medications Ordered in ED Medications  ondansetron (ZOFRAN) injection 4 mg (not administered)  sodium chloride 0.9 % bolus 1,000 mL (not administered)  ondansetron (ZOFRAN-ODT) disintegrating tablet 4 mg (4 mg Oral Given 11/27/17 [id number])     Initial Impression / Assessment and Plan / ED Course  I have reviewed the triage vital signs and the nursing notes.  Pertinent labs & imaging results that were available during my care of the patient were reviewed by me and considered in my medical decision making (see chart for details).     Pt with symptoms most consistent with a viral process with vomitting/diarrhea.  Denies bad food exposure and pt does live internationally in [place].  No recent abx.  No hx concerning for GU pathology or kidney stones.  Pt is awake and alert on exam without peritoneal signs.  9:35 AM Pt vomited tylenol that she took from [facility] and still feeling nauseated.  Will give reglan and a second L of fluids  11:58 AM After reglan pt still has headache but no further vomiting.  She has had 1 episode of diarrhea here but o/w non-toxic appearing.  Still c/o of headache and vomitted up tylenol.  Will give toradol.  1:40 PM No further vomiting.  Headache improved.  No localized abd pain.   Will d/c home and given return precautions.   Final Clinical Impressions(s) / ED Diagnoses   Final diagnoses:  Combined abdominal pain, vomiting, and diarrhea    ED Discharge Orders        Ordered    promethazine (PHENERGAN) 25 MG tablet  Every 6 hours PRN     11/27/17 1341       Gwyneth SproutPlunkett, Angie Piercey, MD 11/27/17 1158    Gwyneth SproutPlunkett, Dailey Buccheri, MD 11/27/17 1342

## 2018-04-13 ENCOUNTER — Encounter (HOSPITAL_BASED_OUTPATIENT_CLINIC_OR_DEPARTMENT_OTHER): Payer: Self-pay | Admitting: Emergency Medicine

## 2018-04-13 ENCOUNTER — Other Ambulatory Visit: Payer: Self-pay

## 2018-04-13 ENCOUNTER — Emergency Department (HOSPITAL_BASED_OUTPATIENT_CLINIC_OR_DEPARTMENT_OTHER)
Admission: EM | Admit: 2018-04-13 | Discharge: 2018-04-13 | Disposition: A | Discharge status: Home / Self Care | Payer: Self-pay | Attending: Emergency Medicine | Admitting: Emergency Medicine

## 2018-04-13 DIAGNOSIS — R109 Unspecified abdominal pain: Secondary | ICD-10-CM | POA: Insufficient documentation

## 2018-04-13 DIAGNOSIS — Z5321 Procedure and treatment not carried out due to patient leaving prior to being seen by health care provider: Secondary | ICD-10-CM | POA: Insufficient documentation

## 2018-04-13 LAB — URINALYSIS, ROUTINE W REFLEX MICROSCOPIC
Bilirubin Urine: NEGATIVE
Glucose, UA: NEGATIVE mg/dL
Hgb urine dipstick: NEGATIVE
KETONES UR: NEGATIVE mg/dL
LEUKOCYTES UA: NEGATIVE
NITRITE: NEGATIVE
PROTEIN: NEGATIVE mg/dL
Specific Gravity, Urine: 1.015 (ref 1.005–1.030)
pH: 7.5 (ref 5.0–8.0)

## 2018-04-13 LAB — PREGNANCY, URINE: PREG TEST UR: NEGATIVE

## 2018-04-13 NOTE — ED Triage Notes (Signed)
Patient states that she has had abdominal pain and burning to her mid stomach x " months" - the patient states that [place] and reports that she is having severe indegestion

## 2019-02-20 ENCOUNTER — Encounter (HOSPITAL_COMMUNITY): Payer: Self-pay | Admitting: *Deleted

## 2019-02-20 ENCOUNTER — Emergency Department (HOSPITAL_COMMUNITY)
Admission: EM | Admit: 2019-02-20 | Discharge: 2019-02-20 | Disposition: A | Discharge status: Home / Self Care | Payer: BLUE CROSS/BLUE SHIELD | Attending: Emergency Medicine | Admitting: Emergency Medicine

## 2019-02-20 ENCOUNTER — Other Ambulatory Visit: Payer: Self-pay

## 2019-02-20 ENCOUNTER — Emergency Department (HOSPITAL_COMMUNITY): Payer: BLUE CROSS/BLUE SHIELD

## 2019-02-20 ENCOUNTER — Emergency Department (HOSPITAL_COMMUNITY)
Admission: EM | Admit: 2019-02-20 | Discharge: 2019-02-20 | Discharge status: Against Medical Advice | Payer: Medicaid Other

## 2019-02-20 DIAGNOSIS — R0981 Nasal congestion: Secondary | ICD-10-CM | POA: Diagnosis present

## 2019-02-20 DIAGNOSIS — Z79899 Other long term (current) drug therapy: Secondary | ICD-10-CM | POA: Insufficient documentation

## 2019-02-20 DIAGNOSIS — J111 Influenza due to unidentified influenza virus with other respiratory manifestations: Secondary | ICD-10-CM | POA: Insufficient documentation

## 2019-02-20 DIAGNOSIS — R69 Illness, unspecified: Secondary | ICD-10-CM

## 2019-02-20 LAB — INFLUENZA PANEL BY PCR (TYPE A & B)
Influenza A By PCR: NEGATIVE
Influenza B By PCR: NEGATIVE

## 2019-02-20 MED ORDER — NAPROXEN 500 MG PO TABS: 500.0000 mg | ORAL_TABLET | Freq: Two times a day (BID) | ORAL | 0 refills | Status: DC

## 2019-02-20 MED ORDER — IBUPROFEN 800 MG PO TABS
800.0000 mg | ORAL_TABLET | Freq: Once | ORAL | Status: AC
  Administered 2019-02-20: 800 mg via ORAL
  Filled 2019-02-20: qty 1

## 2019-02-20 MED ORDER — BENZONATATE 100 MG PO CAPS: 100.0000 mg | ORAL_CAPSULE | Freq: Three times a day (TID) | ORAL | 0 refills | Status: AC

## 2019-02-20 MED ORDER — FLUTICASONE PROPIONATE 50 MCG/ACT NA SUSP: 1.0000 | Freq: Every day | NASAL | 0 refills | Status: AC

## 2019-02-20 NOTE — ED Notes (Addendum)
Pt concerned about recent travel from [place].  No symptoms at this time.  Pt decided she will not get seen today.

## 2019-02-20 NOTE — ED Triage Notes (Signed)
Pt states she had a fever 5 days ago. Pt just returned yesterday from travel in [place].Pt had cough for a couple of days this week, which has now improved. Pt also complains of body ache.

## 2019-02-20 NOTE — ED Notes (Signed)
Called infectious control, they state the patient does not meet criteria for Covid 19 testing because patients symptoms are improving and does not currently have fever. They suggest testing for flu.

## 2019-02-20 NOTE — ED Provider Notes (Signed)
[facility]-EMERGENCY DEPT Provider Note   CSN: [id number] Arrival date & time: 02/20/19  1622    History   Chief Complaint Chief Complaint  Patient presents with  . Generalized Body Aches  . Fever  . Cough    HPI Whitney Thompson is a 28 y.o. female without significant past medical hx who presents to the ED with complains of flu like sxs x 1 week. Patient reports congestion, rhinorrhea, ear pain, sore throat, cough productive of mucous sputum, generalized body aches, and fevers. She states sxs are constant. No alleviating/aggravating factors. 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 Overall she feels somewhat better as fevers have resolved. Denies dyspnea, wheezing, chest pain, emesis, or diarrhea. Denies confirmed exposure to lab positive covid patient.      HPI  History reviewed. No pertinent past medical history.  There are no active problems to display for this patient.   History reviewed. No pertinent surgical history.   OB History   No obstetric history on file.      Home Medications    Prior to Admission medications   Medication Sig Start Date End Date Taking? Authorizing Provider  HYDROcodone-acetaminophen (NORCO/VICODIN) 5-325 MG per tablet Take 1 tablet by mouth every 4 (four) hours as needed for moderate pain. Patient not taking: Reported on 11/27/2017 04/19/14   Schinlever, Santina Evans, PA-C  Multiple Vitamin (MULITIVITAMIN WITH MINERALS) TABS Take 1 tablet by mouth daily.    [provider]  naproxen sodium (ANAPROX) 220 MG tablet Take 440 mg by mouth 2 (two) times daily with a meal.    [provider]  promethazine (PHENERGAN) 25 MG tablet Take 1 tablet (25 mg total) by mouth every 6 (six) hours as needed for nausea or vomiting. 11/27/17   Gwyneth Sprout, MD  traMADol (ULTRAM) 50 MG tablet Take 1 tablet (50 mg total) by mouth every 6  (six) hours as needed. Patient not taking: Reported on 11/27/2017 04/20/14   Emilia Beck, PA-C    Family History Family History  Problem Relation Age of Onset  . Kidney disease Other   . Hypertension Other     Social History Social History   Tobacco Use  . Smoking status: Never Smoker  . Smokeless tobacco: Never Used  Substance Use Topics  . Alcohol use: No  . Drug use: No     Allergies   Codeine   Review of Systems Review of Systems  Constitutional: Positive for chills and fever (resolved).  HENT: Positive for congestion, ear pain and sore throat.   Respiratory: Positive for cough. Negative for shortness of breath and wheezing.   Cardiovascular: Negative for chest pain.  Gastrointestinal: Negative for abdominal pain, diarrhea and vomiting.  Musculoskeletal: Positive for myalgias.     Physical Exam Updated Vital Signs BP 120/80 (BP Location: Left Arm)   Pulse (!) 59   Temp 98.2 F (36.8 C) (Oral)   Resp 18   SpO2 100%   Physical Exam Vitals signs and nursing note reviewed.  Constitutional:      General: She is not in acute distress.    Appearance: She is well-developed.  HENT:     Head: Normocephalic and atraumatic.     Right Ear: Ear canal normal. Tympanic membrane is not perforated, erythematous, retracted or bulging.     Left Ear: Ear canal normal. Tympanic membrane is not perforated, erythematous, retracted or bulging.  Ears:     Comments: No mastoid erythema/swelling/tenderness.     Nose:     Right Sinus: No maxillary sinus tenderness or frontal sinus tenderness.     Left Sinus: No maxillary sinus tenderness or frontal sinus tenderness.     Mouth/Throat:     Pharynx: Uvula midline. No oropharyngeal exudate or posterior oropharyngeal erythema.     Comments: Posterior oropharynx is symmetric appearing. Patient tolerating own secretions without difficulty. No trismus. No drooling. No hot potato voice. No swelling beneath the tongue,  submandibular compartment is soft.  Eyes:     General:        Right eye: No discharge.        Left eye: No discharge.     Conjunctiva/sclera: Conjunctivae normal.     Pupils: Pupils are equal, round, and reactive to light.  Neck:     Musculoskeletal: Normal range of motion and neck supple. No edema or neck rigidity.  Cardiovascular:     Rate and Rhythm: Regular rhythm. Bradycardia present.     Heart sounds: No murmur.  Pulmonary:     Effort: Pulmonary effort is normal. No respiratory distress.     Breath sounds: Normal breath sounds. No wheezing, rhonchi or rales.  Abdominal:     General: There is no distension.     Palpations: Abdomen is soft.     Tenderness: There is no abdominal tenderness.  Lymphadenopathy:     Cervical: No cervical adenopathy.  Skin:    General: Skin is warm and dry.     Findings: No rash.  Neurological:     Mental Status: She is alert.  Psychiatric:        Behavior: Behavior normal.      ED Treatments / Results  Labs (all labs ordered are listed, but only abnormal results are displayed) Labs Reviewed  INFLUENZA PANEL BY PCR (TYPE A & B)    EKG None  Radiology Dg Chest 2 View  Result Date: 02/20/2019 CLINICAL DATA:  Cough and congestion EXAM: CHEST - 2 VIEW COMPARISON:  November 08, 2004 FINDINGS: There is no edema or consolidation. The heart size and pulmonary vascularity are normal. No adenopathy. No bone lesions. IMPRESSION: No edema or consolidation. Electronically Signed   By: Bretta Bang III M.D.   On: 02/20/2019 18:05    Procedures Procedures (including critical care time)  Medications Ordered in ED Medications - No data to display   Initial Impression / Assessment and Plan / ED Course  I have reviewed the triage vital signs and the nursing notes.  Pertinent labs & imaging results that were available during my care of the patient were reviewed by me and considered in my medical decision making (see chart for details).    Patient presents to the ER w/ flu like illness. Patient nontoxic appearing, in no apparent distress, vitals without significant abnormality.  Patient is afebrile in the ED, lungs are CTA, CXR  negative for infiltrate, doubt pneumonia. There is no wheezing or signs of respiratory distress. Sxs onset < 10 days, afebrile in the ER, no sinus tenderness, doubt acute bacterial sinusitis. Centor score 0, doubt strep pharyngitis. No evidence of AOM on exam. No meningeal signs. RN spoke with Infection Prevention and patient reportedly does not meet criteria for coronavirus testing at this time per IP w/ limited swabs & improving sxs w/ lack of fever; however, given patient's hx of foreign travel I have recommended 14 day self quarantine for safety per discussion with supervising physician  Dr. Lockie Mola. Will treat supportively with Naproxen, Flonase, and Tessalon. I discussed results, treatment plan, need for PCP or health dept. follow-up, and return precautions with the patient & her mother. Provided opportunity for questions, patient & her mother confirmed understanding and are in agreement with plan.    Final Clinical Impressions(s) / ED Diagnoses   Final diagnoses:  Influenza-like illness    ED Discharge Orders         Ordered    fluticasone (FLONASE) 50 MCG/ACT nasal spray  Daily     02/20/19 1942    benzonatate (TESSALON) 100 MG capsule  Every 8 hours     02/20/19 1942    naproxen (NAPROSYN) 500 MG tablet  2 times daily     02/20/19 [address] PA-C 02/20/19 1944    Virgina Norfolk, DO 02/20/19 2330

## 2019-02-20 NOTE — Discharge Instructions (Addendum)
You were seen in the emergency department today for flulike symptoms.  Your chest x-ray was normal.  We have tested you for influenza and this is negative.  We are unsure of the exact cause of your symptoms at this time.  Currently testing for coronavirus is limited secondary to low supply.  Therefore it remains a possibility that you had coronavirus.  We recommend self quarantining in isolation for 14 days in order to prevent spread of infection that is possible.  Please sure to wash your hands regularly.  We have also provided medications to help with your symptoms.  -Flonase to be used 1 spray in each nostril daily.  This medication is used to treat your congestion.  -Tessalon can be taken once every 8 hours as needed.  This medication is used to treat your cough.  - Naproxen is a nonsteroidal anti-inflammatory medication that will help with pain and swelling. Be sure to take this medication as prescribed with food, 1 pill every 12 hours,  It should be taken with food, as it can cause stomach upset, and more seriously, stomach bleeding. Do not take other nonsteroidal anti-inflammatory medications with this such as Advil, Motrin, Aleve, Mobic, Goodie Powder, or Motrin.    You make take Tylenol per over the counter dosing with these medications.   We have prescribed you new medication(s) today. Discuss the medications prescribed today with your pharmacist as they can have adverse effects and interactions with your other medicines including over the counter and prescribed medications. Seek medical evaluation if you start to experience new or abnormal symptoms after taking one of these medicines, seek care immediately if you start to experience difficulty breathing, feeling of your throat closing, facial swelling, or rash as these could be indications of a more serious allergic reaction   Follow-up with primary care  and or the health department within 3-5 days.  Please call your primary care office/  health department and inform them of your symptoms and your travel before going to the office.  Return to the ER for new or worsening symptoms including but not limited to return of fever, worsening cough, trouble breathing, or any other concerns.

## 2019-02-24 ENCOUNTER — Other Ambulatory Visit: Payer: Self-pay

## 2019-02-24 ENCOUNTER — Emergency Department (HOSPITAL_COMMUNITY): Payer: BLUE CROSS/BLUE SHIELD

## 2019-02-24 ENCOUNTER — Emergency Department (HOSPITAL_COMMUNITY)
Admission: EM | Admit: 2019-02-24 | Discharge: 2019-02-24 | Disposition: A | Discharge status: Home / Self Care | Payer: BLUE CROSS/BLUE SHIELD | Attending: Emergency Medicine | Admitting: Emergency Medicine

## 2019-02-24 DIAGNOSIS — Z79899 Other long term (current) drug therapy: Secondary | ICD-10-CM | POA: Diagnosis not present

## 2019-02-24 DIAGNOSIS — J111 Influenza due to unidentified influenza virus with other respiratory manifestations: Secondary | ICD-10-CM | POA: Insufficient documentation

## 2019-02-24 DIAGNOSIS — Z20828 Contact with and (suspected) exposure to other viral communicable diseases: Secondary | ICD-10-CM | POA: Diagnosis not present

## 2019-02-24 DIAGNOSIS — R05 Cough: Secondary | ICD-10-CM | POA: Diagnosis present

## 2019-02-24 DIAGNOSIS — R69 Illness, unspecified: Secondary | ICD-10-CM

## 2019-02-24 LAB — CBC
HCT: 44.3 % (ref 36.0–46.0)
HEMOGLOBIN: 14.6 g/dL (ref 12.0–15.0)
MCH: 31 pg (ref 26.0–34.0)
MCHC: 33 g/dL (ref 30.0–36.0)
MCV: 94.1 fL (ref 80.0–100.0)
PLATELETS: 227 10*3/uL (ref 150–400)
RBC: 4.71 MIL/uL (ref 3.87–5.11)
RDW: 12.5 % (ref 11.5–15.5)
WBC: 7.6 10*3/uL (ref 4.0–10.5)
nRBC: 0 % (ref 0.0–0.2)

## 2019-02-24 LAB — RESPIRATORY PANEL BY PCR
ADENOVIRUS-RVPPCR: NOT DETECTED
Bordetella pertussis: NOT DETECTED
CORONAVIRUS NL63-RVPPCR: NOT DETECTED
CORONAVIRUS OC43-RVPPCR: DETECTED — AB
Chlamydophila pneumoniae: NOT DETECTED
Coronavirus 229E: NOT DETECTED
Coronavirus HKU1: NOT DETECTED
INFLUENZA A-RVPPCR: NOT DETECTED
Influenza B: NOT DETECTED
Metapneumovirus: NOT DETECTED
Mycoplasma pneumoniae: NOT DETECTED
PARAINFLUENZA VIRUS 1-RVPPCR: NOT DETECTED
PARAINFLUENZA VIRUS 3-RVPPCR: NOT DETECTED
PARAINFLUENZA VIRUS 4-RVPPCR: NOT DETECTED
Parainfluenza Virus 2: NOT DETECTED
RHINOVIRUS / ENTEROVIRUS - RVPPCR: NOT DETECTED
Respiratory Syncytial Virus: NOT DETECTED

## 2019-02-24 LAB — BASIC METABOLIC PANEL
ANION GAP: 9 (ref 5–15)
BUN: 26 mg/dL — ABNORMAL HIGH (ref 6–20)
CHLORIDE: 103 mmol/L (ref 98–111)
CO2: 25 mmol/L (ref 22–32)
Calcium: 9.5 mg/dL (ref 8.9–10.3)
Creatinine, Ser: 1.07 mg/dL — ABNORMAL HIGH (ref 0.44–1.00)
GFR calc Af Amer: >60 mL/min (ref >60)
Glucose, Bld: 77 mg/dL (ref 70–99)
Potassium: 3.8 mmol/L (ref 3.5–5.1)
SODIUM: 137 mmol/L (ref 135–145)

## 2019-02-24 NOTE — ED Notes (Signed)
Infection Prevention called

## 2019-02-24 NOTE — ED Triage Notes (Signed)
Pt from home.  Pt c/o of feeling weak since Saturday (3/7).  Pt was traveling in [place] then [place] for basketball.  Pt c/o of SOB x2 days.  Pt states she began having a cough on Mon/tuesday of last week (3/9-3/10).  Pt states she had a fever last Monday. 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

## 2019-02-24 NOTE — ED Notes (Signed)
The phone for room 12 is at pt's bedside for staff members to be able to call pt in the room.  Call bell at bedside.  Pt calm, and cooperative.  Pt's mother will be coming to visit pt.

## 2019-02-24 NOTE — ED Notes (Signed)
Bed: HY07 Expected date:  Expected time:  Means of arrival:  Comments: Held for negative pressure

## 2019-02-24 NOTE — Discharge Instructions (Addendum)
Take tylenol 2 pills 4 times a day and motrin 4 pills 3 times a day.  Drink plenty of fluids.  Return for worsening shortness of breath, headache, confusion. Follow up with your family doctor.       Person Under Monitoring Name: Whitney Thompson  Location: [address] [place] [address]   Infection Prevention Recommendations for Individuals Confirmed to have, or Being Evaluated for, 2019 Novel Coronavirus (COVID-19) Infection Who Receive Care at Home  Individuals who are confirmed to have, or are being evaluated for, COVID-19 should follow the prevention steps below until a healthcare provider or local or state health department says they can return to normal activities.  Stay home except to get medical care You should restrict activities outside your home, except for getting medical care. Do not go to work, school, or public areas, and do not use public transportation or taxis.  Call ahead before visiting your doctor Before your medical appointment, call the healthcare provider and tell them that you have, or are being evaluated for, COVID-19 infection. This will help the healthcare providers office take steps to keep other people from getting infected. Ask your healthcare provider to call the local or state health department.  Monitor your symptoms Seek prompt medical attention if your illness is worsening (e.g., difficulty breathing). Before going to your medical appointment, call the healthcare provider and tell them that you have, or are being evaluated for, COVID-19 infection. Ask your healthcare provider to call the local or state health department.  Wear a facemask You should wear a facemask that covers your nose and mouth when you are in the same room with other people and when you visit a healthcare provider. People who live with or visit you should also wear a facemask while they are in the same room with you.  Separate yourself from other people in your home As  much as possible, you should stay in a different room from other people in your home. Also, you should use a separate bathroom, if available.  Avoid sharing household items You should not share dishes, drinking glasses, cups, eating utensils, towels, bedding, or other items with other people in your home. After using these items, you should wash them thoroughly with soap and water.  Cover your coughs and sneezes Cover your mouth and nose with a tissue when you cough or sneeze, or you can cough or sneeze into your sleeve. Throw used tissues in a lined trash can, and immediately wash your hands with soap and water for at least 20 seconds or use an alcohol-based hand rub.  Wash your [employer] your hands often and thoroughly with soap and water for at least 20 seconds. You can use an alcohol-based hand sanitizer if soap and water are not available and if your hands are not visibly dirty. Avoid touching your eyes, nose, and mouth with unwashed hands.   Prevention Steps for Caregivers and Household Members of Individuals Confirmed to have, or Being Evaluated for, COVID-19 Infection Being Cared for in the Home  If you live with, or provide care at home for, a person confirmed to have, or being evaluated for, COVID-19 infection please follow these guidelines to prevent infection:  Follow healthcare providers instructions Make sure that you understand and can help the patient follow any healthcare provider instructions for all care.  Provide for the patients basic needs You should help the patient with basic needs in the home and provide support for getting groceries, prescriptions, and  other personal needs.  Monitor the patients symptoms If they are getting sicker, call his or her medical provider and tell them that the patient has, or is being evaluated for, COVID-19 infection. This will help the healthcare providers office take steps to keep other people from getting infected. Ask  the healthcare provider to call the local or state health department.  Limit the number of people who have contact with the patient If possible, have only one caregiver for the patient. Other household members should stay in another home or place of residence. If this is not possible, they should stay in another room, or be separated from the patient as much as possible. Use a separate bathroom, if available. Restrict visitors who do not have an essential need to be in the home.  Keep older adults, very young children, and other sick people away from the patient Keep older adults, very young children, and those who have compromised immune systems or chronic health conditions away from the patient. This includes people with chronic heart, lung, or kidney conditions, diabetes, and cancer.  Ensure good ventilation Make sure that shared spaces in the home have good air flow, such as from an air conditioner or an opened window, weather permitting.  Wash your hands often Wash your hands often and thoroughly with soap and water for at least 20 seconds. You can use an alcohol based hand sanitizer if soap and water are not available and if your hands are not visibly dirty. Avoid touching your eyes, nose, and mouth with unwashed hands. Use disposable paper towels to dry your hands. If not available, use dedicated cloth towels and replace them when they become wet.  Wear a facemask and gloves Wear a disposable facemask at all times in the room and gloves when you touch or have contact with the patients blood, body fluids, and/or secretions or excretions, such as sweat, saliva, sputum, nasal mucus, vomit, urine, or feces.  Ensure the mask fits over your nose and mouth tightly, and do not touch it during use. Throw out disposable facemasks and gloves after using them. Do not reuse. Wash your hands immediately after removing your facemask and gloves. If your personal clothing becomes contaminated,  carefully remove clothing and launder. Wash your hands after handling contaminated clothing. Place all used disposable facemasks, gloves, and other waste in a lined container before disposing them with other household waste. Remove gloves and wash your hands immediately after handling these items.  Do not share dishes, glasses, or other household items with the patient Avoid sharing household items. You should not share dishes, drinking glasses, cups, eating utensils, towels, bedding, or other items with a patient who is confirmed to have, or being evaluated for, COVID-19 infection. After the person uses these items, you should wash them thoroughly with soap and water.  Wash laundry thoroughly Immediately remove and wash clothes or bedding that have blood, body fluids, and/or secretions or excretions, such as sweat, saliva, sputum, nasal mucus, vomit, urine, or feces, on them. Wear gloves when handling laundry from the patient. Read and follow directions on labels of laundry or clothing items and detergent. In general, wash and dry with the warmest temperatures recommended on the label.  Clean all areas the individual has used often Clean all touchable surfaces, such as counters, tabletops, doorknobs, bathroom fixtures, toilets, phones, keyboards, tablets, and bedside tables, every day. Also, clean any surfaces that may have blood, body fluids, and/or secretions or excretions on them. Wear gloves when cleaning  surfaces the patient has come in contact with. Use a diluted bleach solution (e.g., dilute bleach with 1 part bleach and 10 parts water) or a household disinfectant with a label that says EPA-registered for coronaviruses. To make a bleach solution at home, add 1 tablespoon of bleach to 1 quart (4 cups) of water. For a larger supply, add  cup of bleach to 1 gallon (16 cups) of water. Read labels of cleaning products and follow recommendations provided on product labels. Labels contain  instructions for safe and effective use of the cleaning product including precautions you should take when applying the product, such as wearing gloves or eye protection and making sure you have good ventilation during use of the product. Remove gloves and wash hands immediately after cleaning.  Monitor yourself for signs and symptoms of illness Caregivers and household members are considered close contacts, should monitor their health, and will be asked to limit movement outside of the home to the extent possible. Follow the monitoring steps for close contacts listed on the symptom monitoring form.   ? If you have additional questions, contact your local health department or call the epidemiologist on call at [phone] (available 24/7). ? This guidance is subject to change. For the most up-to-date guidance from [facility], please refer to their website: [email or web address]

## 2019-02-24 NOTE — ED Provider Notes (Addendum)
[facility]-EMERGENCY DEPT Provider Note   CSN: [id number] Arrival date & time: 02/24/19  1514    History   Chief Complaint No chief complaint on file.   HPI Whitney Thompson is a 28 y.o. female.     28 yo F with a chief complaint of cough congestion fever and shortness of breath.  This is about day 11 for the patient's illness.  She initially became ill when she was in [place] on a tour for a basketball team.  Initially started with significant coughing and fevers and myalgias.  She return to the [place] when there was concern about an outbreak for the novel coronavirus.  Since then the patient has been doing okay but has had worsening shortness of breath over the past 48 hours or so.  I have been seen in the ED about 4 days ago at that time they were not testing her for the novel coronavirus.  She has had some episodic fevers still.  Has been tolerating p.o.'s.  Denies abdominal pain vomiting or diarrhea.  The history is provided by the patient.  Illness  Severity:  Moderate Onset quality:  Gradual Duration:  11 days Timing:  Constant Progression:  Worsening Chronicity:  New Associated symptoms: congestion, cough, fatigue, fever, myalgias and shortness of breath   Associated symptoms: no chest pain, no headaches, no nausea, no rhinorrhea, no vomiting and no wheezing     No past medical history on file.  There are no active problems to display for this patient.   No past surgical history on file.   OB History   No obstetric history on file.      Home Medications    Prior to Admission medications   Medication Sig Start Date End Date Taking? Authorizing Provider  benzonatate (TESSALON) 100 MG capsule Take 1 capsule (100 mg total) by mouth every 8 (eight) hours. 02/20/19  Yes Petrucelli, Samantha R, PA-C  fexofenadine (ALLEGRA) 180 MG tablet Take 180 mg by mouth daily as needed for allergies or rhinitis.   Yes [provider]   fluticasone (FLONASE) 50 MCG/ACT nasal spray Place 1 spray into both nostrils daily. 02/20/19  Yes Petrucelli, Samantha R, PA-C  Multiple Vitamin (MULITIVITAMIN WITH MINERALS) TABS Take 1 tablet by mouth daily.   Yes [provider]  naproxen (NAPROSYN) 500 MG tablet Take 1 tablet (500 mg total) by mouth 2 (two) times daily. 02/20/19  Yes Petrucelli, Samantha R, PA-C  pseudoephedrine (SUDAFED) 120 MG 12 hr tablet Take 120 mg by mouth daily as needed for congestion.   Yes [provider]  promethazine (PHENERGAN) 25 MG tablet Take 1 tablet (25 mg total) by mouth every 6 (six) hours as needed for nausea or vomiting. Patient not taking: Reported on 02/24/2019 11/27/17   Gwyneth Sprout, MD    Family History Family History  Problem Relation Age of Onset  . Kidney disease Other   . Hypertension Other     Social History Social History   Tobacco Use  . Smoking status: Never Smoker  . Smokeless tobacco: Never Used  Substance Use Topics  . Alcohol use: No  . Drug use: No     Allergies   Codeine   Review of Systems Review of Systems  Constitutional: Positive for fatigue and fever. Negative for chills.  HENT: Positive for congestion. Negative for rhinorrhea.   Eyes: Negative for redness and visual disturbance.  Respiratory: Positive for cough and shortness of breath. Negative for wheezing.  Cardiovascular: Negative for chest pain and palpitations.  Gastrointestinal: Negative for nausea and vomiting.  Genitourinary: Negative for dysuria and urgency.  Musculoskeletal: Positive for myalgias. Negative for arthralgias.  Skin: Negative for pallor and wound.  Neurological: Negative for dizziness, weakness and headaches.     Physical Exam Updated Vital Signs BP 123/82   Pulse 61   Temp 98.3 F (36.8 C) (Oral)   Resp (!) 21   Ht 6' (1.829 m)   Wt 68.5 kg   LMP 02/16/2019   SpO2 100%   BMI 20.48 kg/m   Physical Exam Vitals signs and nursing note reviewed.   Constitutional:      General: She is not in acute distress.    Appearance: She is well-developed. She is not diaphoretic.  HENT:     Head: Normocephalic and atraumatic.  Neck:     Musculoskeletal: Normal range of motion and neck supple.  Pulmonary:     Effort: Pulmonary effort is normal.  Skin:    General: Skin is warm and dry.  Neurological:     Mental Status: She is alert and oriented to person, place, and time.     GCS: GCS eye subscore is 4. GCS verbal subscore is 5. GCS motor subscore is 6.     Gait: Gait is intact.  Psychiatric:        Behavior: Behavior normal.      ED Treatments / Results  Labs (all labs ordered are listed, but only abnormal results are displayed) Labs Reviewed  BASIC METABOLIC PANEL - Abnormal; Notable for the following components:      Result Value   BUN 26 (*)    Creatinine, Ser 1.07 (*)    All other components within normal limits  RESPIRATORY PANEL BY PCR  NOVEL CORONAVIRUS, NAA (HOSPITAL ORDER, SEND-OUT TO REF LAB)  CBC    EKG EKG Interpretation  Date/Time:  Wednesday February 24 2019 16:04:50 EDT Ventricular Rate:  59 PR Interval:    QRS Duration: 82 QT Interval:  433 QTC Calculation: 429 R Axis:   57 Text Interpretation:  Sinus rhythm Probable left atrial enlargement No old tracing to compare Confirmed by Melene Plan [phone]) on 02/24/2019 4:25:38 PM   Radiology Dg Chest Port 1 View  Result Date: 02/24/2019 CLINICAL DATA:  Shortness of breath for 2 days. Weakness since 02/13/2019. EXAM: PORTABLE CHEST 1 VIEW COMPARISON:  PA and lateral chest 02/20/2019 and 11/08/2004. FINDINGS: The lungs are clear. Heart size is normal. No pneumothorax or pleural effusion. No acute or focal bony abnormality. IMPRESSION: Normal chest. Electronically Signed   By: Drusilla Kanner M.D.   On: 02/24/2019 16:14    Procedures Procedures (including critical care time)  Medications Ordered in ED Medications - No data to display   Initial Impression /  Assessment and Plan / ED Course  I have reviewed the triage vital signs and the nursing notes.  Pertinent labs & imaging results that were available during my care of the patient were reviewed by me and considered in my medical decision making (see chart for details).        28 yo F here with concern for a viral pneumonia.  Patient unfortunately was recently in [place] where there is a significant outbreak of the novel coronavirus.  She states that on her plane flight back she was accompanied by multiple [place] national stools seem to be a iliac.  She actually got sick prior to getting on the airplane.  She has noted to have  some congestion which makes the coronavirus less likely based on the initial statistics in [place].  However with her recent travel and had persistent and worsening symptoms we will test her today.  She is well-appearing and nontoxic has 100% oxygen saturation on room air.  There is no tachypnea.  She is able to talk in complete sentences.  I do not feel that she needs to be admitted at this time.  We will have her self isolate at home.  5:32 PM:  I have discussed the diagnosis/risks/treatment options with the patient and family and believe the pt to be eligible for discharge home to follow-up with PCP. We also discussed returning to the ED immediately if new or worsening sx occur. We discussed the sx which are most concerning (e.g., sudden worsening sob, confusion, inability to tolerate by mouth) that necessitate immediate return. Medications administered to the patient during their visit and any new prescriptions provided to the patient are listed below.  Medications given during this visit Medications - No data to display   The patient appears reasonably screen and/or stabilized for discharge and I doubt any other medical condition or other [facility] requiring further screening, evaluation, or treatment in the ED at this time prior to discharge.    Final Clinical Impressions(s) /  ED Diagnoses   Final diagnoses:  Influenza-like illness    ED Discharge Orders    None       Melene Plan, DO 02/24/19 1732    Melene Plan, DO 02/24/19 1733

## 2019-02-24 NOTE — ED Notes (Signed)
Pt given information on self quarantine, symptom management, and how to contact the health department for further questions.

## 2019-03-05 LAB — NOVEL CORONAVIRUS, NAA (HOSP ORDER, SEND-OUT TO REF LAB; TAT 18-24 HRS)

## 2019-03-05 LAB — NOVEL CORONAVIRUS, NAA (HOSPITAL ORDER, SEND-OUT TO REF LAB): SARS-COV-2, NAA: NOT DETECTED

## 2020-02-24 ENCOUNTER — Emergency Department (HOSPITAL_COMMUNITY): Admission: EM | Admit: 2020-02-24 | Payer: BLUE CROSS/BLUE SHIELD | Source: Home / Self Care

## 2020-05-31 ENCOUNTER — Other Ambulatory Visit: Payer: Self-pay

## 2020-05-31 ENCOUNTER — Encounter: Payer: Self-pay | Admitting: Physical Therapy

## 2020-05-31 ENCOUNTER — Ambulatory Visit: Payer: 59 | Attending: Orthopedic Surgery | Admitting: Physical Therapy

## 2020-05-31 DIAGNOSIS — R600 Localized edema: Secondary | ICD-10-CM | POA: Insufficient documentation

## 2020-05-31 DIAGNOSIS — M6281 Muscle weakness (generalized): Secondary | ICD-10-CM | POA: Diagnosis present

## 2020-05-31 DIAGNOSIS — M25572 Pain in left ankle and joints of left foot: Secondary | ICD-10-CM | POA: Diagnosis not present

## 2020-05-31 NOTE — Therapy (Signed)
[facility], [place] [place] [address] [place], [place], [address] Phone: [phone]   Fax:  [phone]  Physical Therapy Evaluation  Patient Details  Name: Whitney Thompson MRN: [id number] Date of Birth: 09-15-1991 Referring Provider (PT): Jamelle Haring Daws   Encounter Date: 05/31/2020   PT End of Session - 05/31/20 1812    Visit Number 1    Date for PT Re-Evaluation 07/31/20    PT Start Time 1618    PT Stop Time 1700    PT Time Calculation (min) 42 min    Activity Tolerance Patient tolerated treatment well    Behavior During Therapy [facility] for tasks assessed/performed           History reviewed. No pertinent past medical history.  History reviewed. No pertinent surgical history.  There were no vitals filed for this visit.    Subjective Assessment - 05/31/20 1622    Subjective Pt reports to clinic L ankle pain that started about a month ago while sitting around. Pt states she noticed a popping on the medial side of L ankle and since then she has had intermittent pain and popping on L ankle. Pt states that it hurts when she gets started playing and then eases as she warms up. Pt feels achey and sore the next day after playing on L foot.    Patient Stated Goals get rid of pain, play basketball    Currently in Pain? Yes    Pain Score 6     Pain Location Ankle    Pain Orientation Left    Pain Descriptors / Indicators Sharp;Aching    Pain Radiating Towards along posterior tib tendon    Pain Onset More than a month ago    Pain Frequency Intermittent    Aggravating Factors  playing basketball, going up on toes    Pain Relieving Factors ice, deep tissue massage                            Objective measurements completed on examination: See above findings.               PT Education - 05/31/20 1812    Education Details Pt educated on POC, HEP, and taping method    Person(s) Educated Patient    Methods  Explanation;Demonstration;Handout    Comprehension Verbalized understanding;Returned demonstration            PT Short Term Goals - 06/01/20 0805      PT SHORT TERM GOAL #1   Title Pt will be independent with HEP    Time 2    Period Weeks    Status New    Target Date 06/15/20             PT Long Term Goals - 06/01/20 0806      PT LONG TERM GOAL #1   Title Pt will report ability to play through basketball game/training session with no increase in L foot pain during or after    Time 6    Period Weeks    Status New    Target Date 07/13/20      PT LONG TERM GOAL #2   Title Pt will report resolution of tenderness/pain in L post tib tendon/muscle    Time 6    Period Weeks    Status New    Target Date 07/13/20      PT LONG TERM GOAL #3  Title Pt will demonstrate ability to perform 10 squats with no increase in L heel pain.    Time 6    Period Weeks    Status New    Target Date 07/13/20                  Plan - 06/01/20 0756    Clinical Impression Statement Pt presents to clinic with signs and symptoms consistent with L posterior tibial tendonitis. 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 Squatting BLE and single limb is painful at insertion of post tib tendon. Applied leukotape to pt today to offload tendon; assess response to tape next rx. Pt would benefit from skilled PT to address the above impairments.    Personal Factors and Comorbidities Profession;Past/Current Experience    Examination-Activity Limitations Squat;Locomotion Level    Stability/Clinical Decision Making Stable/Uncomplicated    Clinical Decision Making Low    Rehab Potential Good    PT Frequency 1x / week    PT Duration 6 weeks    PT  Treatment/Interventions ADLs/Self Care Home Management;Cryotherapy;Ultrasound;Iontophoresis 4mg /ml Dexamethasone;Gait training;Functional mobility training;Therapeutic activities;Therapeutic exercise;Balance training;Neuromuscular re-education;Manual techniques;Patient/family education;Passive range of motion;Dry needling;Taping;Vasopneumatic Device    PT Next Visit Plan assess response to taping; dynamic/static strengthening, stretching/manual    PT Home Exercise Plan foot intrinisic, post tib stretching, single limb stance with heel raise    Consulted and Agree with Plan of Care Patient           Patient will benefit from skilled therapeutic intervention in order to improve the following deficits and impairments:  Decreased range of motion, Difficulty walking, Pain, Impaired flexibility  Visit Diagnosis: Pain in left ankle and joints of left foot  Muscle weakness (generalized)  Localized edema     Problem List There are no problems to display for this patient.  Amador Cunas, PT, DPT Donald Prose Shigeo Baugh 06/01/2020, 8:11 AM  [facility] [address] [place], [place], [address] Phone: [phone]   Fax:  [phone]  Name: ALIAYAH TYER MRN: [id number] Date of Birth: 1991-01-19

## 2020-05-31 NOTE — Patient Instructions (Signed)
Access Code: [email or web address] URL: [email or web address] Date: 05/31/2020 Prepared by: Lysle Rubens  Exercises Tibialis Posterior Stretch at Wall - 1 x daily - 7 x weekly - 3 sets - 3 reps - 30 sec hold Towel Scrunches - 1 x daily - 7 x weekly - 3 sets - 10 reps Arch Lifting - 1 x daily - 7 x weekly - 3 sets - 10 reps Standing Heel Raise with Toes Turned Out - 1 x daily - 7 x weekly - 3 sets - 10 reps Standing Heel Raise with Toes Turned In - 1 x daily - 7 x weekly - 3 sets - 10 reps

## 2020-06-07 ENCOUNTER — Ambulatory Visit: Payer: 59 | Admitting: Physical Therapy

## 2020-06-09 ENCOUNTER — Encounter: Payer: 59 | Admitting: Physical Therapy

## 2020-06-21 ENCOUNTER — Encounter: Payer: 59 | Admitting: Physical Therapy

## 2020-06-28 ENCOUNTER — Encounter: Payer: 59 | Admitting: Physical Therapy

## 2020-07-05 ENCOUNTER — Ambulatory Visit: Payer: 59 | Attending: Orthopedic Surgery | Admitting: Physical Therapy

## 2023-06-10 ENCOUNTER — Emergency Department (HOSPITAL_BASED_OUTPATIENT_CLINIC_OR_DEPARTMENT_OTHER)
Admission: EM | Admit: 2023-06-10 | Discharge: 2023-06-10 | Disposition: A | Discharge status: Home / Self Care | Payer: BLUE CROSS/BLUE SHIELD | Attending: Emergency Medicine | Admitting: Emergency Medicine

## 2023-06-10 ENCOUNTER — Other Ambulatory Visit: Payer: Self-pay

## 2023-06-10 ENCOUNTER — Emergency Department (HOSPITAL_BASED_OUTPATIENT_CLINIC_OR_DEPARTMENT_OTHER): Payer: BLUE CROSS/BLUE SHIELD

## 2023-06-10 ENCOUNTER — Encounter (HOSPITAL_BASED_OUTPATIENT_CLINIC_OR_DEPARTMENT_OTHER): Payer: Self-pay | Admitting: Urology

## 2023-06-10 DIAGNOSIS — R0789 Other chest pain: Secondary | ICD-10-CM | POA: Insufficient documentation

## 2023-06-10 MED ORDER — ACETAMINOPHEN 500 MG PO TABS
1000.0000 mg | ORAL_TABLET | Freq: Once | ORAL | Status: AC
  Administered 2023-06-10: 1000 mg via ORAL
  Filled 2023-06-10: qty 2

## 2023-06-10 MED ORDER — NAPROXEN 500 MG PO TABS: 500.0000 mg | ORAL_TABLET | Freq: Two times a day (BID) | ORAL | 0 refills | Status: AC

## 2023-06-10 NOTE — ED Provider Notes (Signed)
[place] [facility] Provider Note   CSN: [id number] Arrival date & time: 06/10/23  1240     History  Chief Complaint  Patient presents with   Chest Injury    Whitney Thompson is a 32 y.o. female.  The history is provided by the patient and medical records. No language interpreter was used.     32 year old female who presents with complaints of chest wall pain.  Patient report for the past 2 weeks she has had persistent pain primarily to her mid to right chest.  She described pain as a pressure sharp sensation worse with palpation and with movement.  Rates the pain as 8 out of 10.  Pain is not associate with fever or chills no lightheadedness or dizziness no nausea vomiting diarrhea back pain abdominal pain or arm pain.  No associate shortness of breath or cough.  She admits that she started a new job and has been exerting herself quite a bit for the past month.  She is also is a very active person who [place].  She is unsure if this is muscle pain but she went to her chiropractor who did some manipulation yesterday but the pain still persist prompting this visit.  She denies any alcohol or tobacco use no significant history of cardiac disease no history of PE or DVT no history of heartburn and eating does not affect her symptoms.  Home Medications Prior to Admission medications   Medication Sig Start Date End Date Taking? Authorizing Provider  benzonatate (TESSALON) 100 MG capsule Take 1 capsule (100 mg total) by mouth every 8 (eight) hours. 02/20/19   Petrucelli, Pleas Koch, PA-C  fexofenadine (ALLEGRA) 180 MG tablet Take 180 mg by mouth daily as needed for allergies or rhinitis.    [provider]  fluticasone (FLONASE) 50 MCG/ACT nasal spray Place 1 spray into both nostrils daily. 02/20/19   Petrucelli, Samantha R, PA-C  Multiple Vitamin (MULITIVITAMIN WITH MINERALS) TABS Take 1 tablet by mouth daily.     [provider]  naproxen (NAPROSYN) 500 MG tablet Take 1 tablet (500 mg total) by mouth 2 (two) times daily. 02/20/19   Petrucelli, Pleas Koch, PA-C  promethazine (PHENERGAN) 25 MG tablet Take 1 tablet (25 mg total) by mouth every 6 (six) hours as needed for nausea or vomiting. Patient not taking: Reported on 02/24/2019 11/27/17   Gwyneth Sprout, MD  pseudoephedrine (SUDAFED) 120 MG 12 hr tablet Take 120 mg by mouth daily as needed for congestion.    [provider]      Allergies    Codeine    Review of Systems   Review of Systems  All other systems reviewed and are negative.   Physical Exam Updated Vital Signs BP 110/83 (BP Location: Right Arm)   Pulse 67   Temp 98.5 F (36.9 C) (Oral)   Resp 18   Ht 6' (1.829 m)   Wt 68.5 kg   LMP 06/03/2023 (Approximate)   SpO2 100%   BMI 20.48 kg/m  Physical Exam Vitals and nursing note reviewed.  Constitutional:      General: She is not in acute distress.    Appearance: She is well-developed.  HENT:     Head: Atraumatic.  Eyes:     Conjunctiva/sclera: Conjunctivae normal.  Cardiovascular:     Rate and Rhythm: Normal rate and regular rhythm.     Pulses: Normal pulses.     Heart  sounds: Normal heart sounds.  Pulmonary:     Effort: Pulmonary effort is normal.     Breath sounds: No wheezing, rhonchi or rales.  Chest:     Chest wall: Tenderness (Tenderness to midsternal and right anterior chest wall on palpation without any overlying skin changes no crepitus or emphysema noted.) present.  Musculoskeletal:        General: Normal range of motion.     Cervical back: Neck supple.     Right lower leg: No edema.     Left lower leg: No edema.  Skin:    Findings: No rash.  Neurological:     Mental Status: She is alert.  Psychiatric:        Mood and Affect: Mood normal.     ED Results / Procedures / Treatments   Labs (all labs ordered are listed, but only abnormal results are displayed) Labs Reviewed   PREGNANCY, URINE    EKG None  Date: 06/10/2023  Rate: 58  Rhythm: normal sinus rhythm  QRS Axis: normal  Intervals: normal  ST/T Wave abnormalities: normal  Conduction Disutrbances: none  Narrative Interpretation:   Old EKG Reviewed: No significant changes noted    Radiology DG Ribs Unilateral W/Chest Right  Result Date: 06/10/2023 CLINICAL DATA:  Rib pain for 2 weeks. EXAM: RIGHT RIBS AND CHEST - 3 VIEW COMPARISON:  Chest x-ray 02/20/2019 FINDINGS: No fracture or other bone lesions are seen involving the ribs. There is no evidence of pneumothorax or pleural effusion. Both lungs are clear. Heart size and mediastinal contours are within normal limits. Overlapping cardiac leads. IMPRESSION: No right-sided rib fracture or pneumothorax. Electronically Signed   By: Karen Kays M.D.   On: 06/10/2023 14:16    Procedures Procedures    Medications Ordered in ED Medications  acetaminophen (TYLENOL) tablet 1,000 mg (1,000 mg Oral Given 06/10/23 1305)    ED Course/ Medical Decision Making/ A&P                             Medical Decision Making Amount and/or Complexity of Data Reviewed Labs: ordered. Radiology: ordered. ECG/medicine tests: ordered.  Risk OTC drugs.   BP 110/83 (BP Location: Right Arm)   Pulse 67   Temp 98.5 F (36.9 C) (Oral)   Resp 18   Ht 6' (1.829 m)   Wt 68.5 kg   LMP 06/03/2023 (Approximate)   SpO2 100%   BMI 20.48 kg/m   79:17 PM  32 year old female who presents with complaints of chest wall pain.  Patient report for the past 2 weeks she has had persistent pain primarily to her mid to right chest.  She described pain as a pressure sharp sensation worse with palpation and with movement.  Rates the pain as 8 out of 10.  Pain is not associate with fever or chills no lightheadedness or dizziness no nausea vomiting diarrhea back pain abdominal pain or arm pain.  No associate shortness of breath or cough.  She admits that she started a new job and has  been exerting herself quite a bit for the past month.  She is also is a very active person who [place] years.  She is unsure if this is muscle pain but she went to her chiropractor who did some manipulation yesterday but the pain still persist prompting this visit.  She denies any alcohol or tobacco use no significant history of cardiac disease no  history of PE or DVT no history of heartburn and eating does not affect her symptoms.  On exam this is a well-appearing female resting comfortably in the bed appears to be in no acute discomfort.  Heart with normal rate and rhythm, lungs clear to auscultation bilaterally chest wall is tender to the midsternal and right medial chest wall without any overlying skin changes or crepitus noted.  Her lungs are clear abdomen is soft nontender she has intact radial pulse bilaterally.  Vital signs overall reassuring no fever no hypoxia.  -Labs ordered, independently viewed and interpreted by me.  Labs remarkable for negative preg test -The patient was maintained on a cardiac monitor.  I personally viewed and interpreted the cardiac monitored which showed an underlying rhythm of: NSR -Imaging independently viewed and interpreted by me and I agree with radiologist's interpretation.  Result remarkable for CXR and R ribs without acute changes -This patient presents to the ED for concern of chest pain, this involves an extensive number of treatment options, and is a complaint that carries with it a high risk of complications and morbidity.  The differential diagnosis includes costochondritis, pleurisy, pna, ptx, ribs fx, acs, gerd, gastritis -Co morbidities that complicate the patient evaluation includes none -Treatment includes tylenol -Reevaluation of the patient after these medicines showed that the patient improved -PCP office notes or outside notes reviewed -Escalation to admission/observation considered: patients feels much better, is  comfortable with discharge, and will follow up with PCP -Prescription medication considered, patient comfortable with NSAIDs -Social Determinant of Health considered  Reproducible chest wall pain likely costochondritis less likely to be an emergent medical condition.  Patient stable for discharge         Final Clinical Impression(s) / ED Diagnoses Final diagnoses:  Chest wall pain    Rx / DC Orders ED Discharge Orders          Ordered    naproxen (NAPROSYN) 500 MG tablet  2 times daily        06/10/23 1456              Fayrene Helper, PA-C 06/10/23 1456    Sloan Leiter, DO 06/12/23 0732

## 2023-06-10 NOTE — ED Triage Notes (Signed)
Pt states right sided chest pain 2 weeks ago that has remained constant  Pain worse with movement and reaching  State possible injury to chest wall with intense workouts

## 2023-06-10 NOTE — ED Notes (Signed)
Patient refused urine specimen order

## 2024-05-04 ENCOUNTER — Other Ambulatory Visit: Payer: Self-pay

## 2024-05-04 ENCOUNTER — Encounter (HOSPITAL_BASED_OUTPATIENT_CLINIC_OR_DEPARTMENT_OTHER): Payer: Self-pay | Admitting: Urology

## 2024-05-04 ENCOUNTER — Emergency Department (HOSPITAL_BASED_OUTPATIENT_CLINIC_OR_DEPARTMENT_OTHER)
Admission: EM | Admit: 2024-05-04 | Discharge: 2024-05-04 | Disposition: A | Discharge status: Home / Self Care | Payer: Self-pay | Attending: Emergency Medicine | Admitting: Emergency Medicine

## 2024-05-04 DIAGNOSIS — J029 Acute pharyngitis, unspecified: Secondary | ICD-10-CM | POA: Insufficient documentation

## 2024-05-04 LAB — GROUP A STREP BY PCR: Group A Strep by PCR: NOT DETECTED

## 2024-05-04 MED ORDER — PENICILLIN V POTASSIUM 500 MG PO TABS: 500.0000 mg | ORAL_TABLET | Freq: Four times a day (QID) | ORAL | 0 refills | Status: AC

## 2024-05-04 MED ORDER — ONDANSETRON HCL 4 MG PO TABS: 4.0000 mg | ORAL_TABLET | Freq: Three times a day (TID) | ORAL | 0 refills | Status: AC | PRN

## 2024-05-04 NOTE — Discharge Instructions (Signed)
 Your strep test was negative.  We are starting you on antibiotics for possible bacterial infection.  Please continue with warm salt water gargles Tylenol  and ibuprofen  for pain and keep well-hydrated.  Return if any worsening or concerning symptoms

## 2024-05-04 NOTE — ED Provider Notes (Signed)
 [place] [facility] Provider Note   CSN: [id number] Arrival date & time: 05/04/24  1745     History {Add pertinent medical, surgical, social history, OB history to HPI:1} Chief Complaint  Patient presents with   Sore Throat    Whitney Thompson is a 33 y.o. female.  She is here with complaint of sore throat for the last few days.  Noticed some white patches on her throat today.  Has felt a little feverish and achy.  No cough vomiting diarrhea.  No sick contacts.  The history is provided by the patient.  Sore Throat This is a new problem. The current episode started 2 days ago. The problem occurs constantly. The problem has not changed since onset.Pertinent negatives include no chest pain, no abdominal pain, no headaches and no shortness of breath. The symptoms are aggravated by swallowing. Nothing relieves the symptoms. She has tried rest for the symptoms. The treatment provided no relief.       Home Medications Prior to Admission medications   Medication Sig Start Date End Date Taking? Authorizing Provider  benzonatate  (TESSALON ) 100 MG capsule Take 1 capsule (100 mg total) by mouth every 8 (eight) hours. 02/20/19   Petrucelli, Samantha R, PA-C  fexofenadine (ALLEGRA) 180 MG tablet Take 180 mg by mouth daily as needed for allergies or rhinitis.    [provider]  fluticasone  (FLONASE ) 50 MCG/ACT nasal spray Place 1 spray into both nostrils daily. 02/20/19   Petrucelli, Samantha R, PA-C  Multiple Vitamin (MULITIVITAMIN WITH MINERALS) TABS Take 1 tablet by mouth daily.    [provider]  naproxen  (NAPROSYN ) 500 MG tablet Take 1 tablet (500 mg total) by mouth 2 (two) times daily. 06/10/23   Debbra Fairy, PA-C  promethazine  (PHENERGAN ) 25 MG tablet Take 1 tablet (25 mg total) by mouth every 6 (six) hours as needed for nausea or vomiting. Patient not taking: Reported on 02/24/2019 11/27/17   Almond Army, MD  pseudoephedrine  (SUDAFED) 120 MG 12 hr tablet Take 120 mg by mouth daily as needed for congestion.    [provider]      Allergies    Codeine    Review of Systems   Review of Systems  Respiratory:  Negative for shortness of breath.   Cardiovascular:  Negative for chest pain.  Gastrointestinal:  Negative for abdominal pain.  Neurological:  Negative for headaches.    Physical Exam Updated Vital Signs BP 110/74 (BP Location: Left Arm)   Pulse 62   Temp 98.7 F (37.1 C)   Resp 18   Ht 6' (1.829 m)   Wt 68.5 kg   SpO2 100%   BMI 20.48 kg/m  Physical Exam Constitutional:      Appearance: She is well-developed.  HENT:     Head: Normocephalic and atraumatic.     Nose: No congestion or rhinorrhea.     Mouth/Throat:     Mouth: Mucous membranes are moist.     Pharynx: Posterior oropharyngeal erythema present. No oropharyngeal exudate or uvula swelling.     Tonsils: Tonsillar exudate (white few spots) present.  Eyes:     Conjunctiva/sclera: Conjunctivae normal.  Musculoskeletal:     Cervical back: Neck supple.  Lymphadenopathy:     Cervical: No cervical adenopathy.  Skin:    General: Skin is warm and dry.  Neurological:     Mental Status: She is alert.     GCS: GCS eye subscore is 4. GCS verbal subscore  is 5. GCS motor subscore is 6.     ED Results / Procedures / Treatments   Labs (all labs ordered are listed, but only abnormal results are displayed) Labs Reviewed  GROUP A STREP BY PCR    EKG None  Radiology No results found.  Procedures Procedures  {Document cardiac monitor, telemetry assessment procedure when appropriate:1}  Medications Ordered in ED Medications - No data to display  ED Course/ Medical Decision Making/ A&P   {   Click here for ABCD2, HEART and other calculatorsREFRESH Note before signing :1}                              Medical Decision Making  This patient complains of ***; this involves an extensive number of treatment Options and is a  complaint that carries with it a high risk of complications and morbidity. The differential includes ***  I ordered, reviewed and interpreted labs, which included *** I ordered medication *** and reviewed PMP when indicated. I ordered imaging studies which included *** and I independently    visualized and interpreted imaging which showed *** Additional history obtained from *** Previous records obtained and reviewed *** I consulted *** and discussed lab and imaging findings and discussed disposition.  Cardiac monitoring reviewed, *** Social determinants considered, *** Critical Interventions: ***  After the interventions stated above, I reevaluated the patient and found *** Admission and further testing considered, ***   {Document critical care time when appropriate:1} {Document review of labs and clinical decision tools ie heart score, Chads2Vasc2 etc:1}  {Document your independent review of radiology images, and any outside records:1} {Document your discussion with family members, caretakers, and with consultants:1} {Document social determinants of health affecting pt's care:1} {Document your decision making why or why not admission, treatments were needed:1} Final Clinical Impression(s) / ED Diagnoses Final diagnoses:  None    Rx / DC Orders ED Discharge Orders     None

## 2024-05-04 NOTE — ED Triage Notes (Signed)
 Pt states sore throat, congestion, and fever x 2-3 days  States noticed red and white patched on throat  Pain with swallowing
